# Patient Record
Sex: Male | Born: 1975 | Race: Black or African American | Hispanic: No | Smoking: Never smoker
Health system: Southern US, Community
[De-identification: ages and names within clinical notes are randomized; demographics above are authoritative.]

---

## 1997-08-24 ENCOUNTER — Other Ambulatory Visit: Admission: RE | Admit: 1997-08-24 | Discharge: 1997-08-24 | Payer: Self-pay | Admitting: Family Medicine

## 2002-10-07 ENCOUNTER — Emergency Department (HOSPITAL_COMMUNITY): Admission: EM | Admit: 2002-10-07 | Discharge: 2002-10-08 | Payer: Self-pay | Admitting: Emergency Medicine

## 2004-08-09 ENCOUNTER — Emergency Department (HOSPITAL_COMMUNITY): Admission: EM | Admit: 2004-08-09 | Discharge: 2004-08-09 | Payer: Self-pay | Admitting: Emergency Medicine

## 2005-05-16 ENCOUNTER — Emergency Department (HOSPITAL_COMMUNITY): Admission: EM | Admit: 2005-05-16 | Discharge: 2005-05-16 | Payer: Self-pay | Admitting: Family Medicine

## 2005-09-02 ENCOUNTER — Emergency Department (HOSPITAL_COMMUNITY): Admission: EM | Admit: 2005-09-02 | Discharge: 2005-09-02 | Payer: Self-pay | Admitting: Family Medicine

## 2013-12-25 ENCOUNTER — Emergency Department (HOSPITAL_COMMUNITY)
Admission: EM | Admit: 2013-12-25 | Discharge: 2013-12-25 | Disposition: A | Discharge status: Home / Self Care | Payer: Self-pay | Attending: Emergency Medicine | Admitting: Emergency Medicine

## 2013-12-25 ENCOUNTER — Emergency Department (HOSPITAL_COMMUNITY): Payer: Self-pay

## 2013-12-25 ENCOUNTER — Encounter (HOSPITAL_COMMUNITY): Payer: Self-pay | Admitting: *Deleted

## 2013-12-25 DIAGNOSIS — H9203 Otalgia, bilateral: Secondary | ICD-10-CM | POA: Insufficient documentation

## 2013-12-25 DIAGNOSIS — J069 Acute upper respiratory infection, unspecified: Secondary | ICD-10-CM | POA: Insufficient documentation

## 2013-12-25 LAB — RAPID STREP SCREEN (MED CTR MEBANE ONLY): Streptococcus, Group A Screen (Direct): NEGATIVE

## 2013-12-25 LAB — TROPONIN I: Troponin I: <0.3 ng/mL (ref <0.30)

## 2013-12-25 LAB — MONONUCLEOSIS SCREEN: Mono Screen: NEGATIVE

## 2013-12-25 NOTE — ED Provider Notes (Signed)
CSN: [id number]     Arrival date & time 12/25/13  1359 History   First MD Initiated Contact with Patient 12/25/13 1508     Chief Complaint  Patient presents with  . Otalgia  . Sore Throat     (Consider location/radiation/quality/duration/timing/severity/associated sxs/prior Treatment) Patient is a 38 y.o. male presenting with ear pain and pharyngitis. The history is provided by the patient. No language interpreter was used.  Otalgia Associated symptoms: congestion, cough and sore throat   Associated symptoms: no abdominal pain, no fever, no headaches, no neck pain and no vomiting   Sore Throat Associated symptoms include chills, congestion, coughing and a sore throat. Pertinent negatives include no abdominal pain, chest pain, fever, headaches, nausea, neck pain, numbness, vomiting or weakness.  Brett Mattesimothy S Hearty is a 38 y/o M with no known significant PMHx presenting to the ED with cough, nasal congestion, sore throat, bilateral ear pain, and chest tightness that has been ongoing for the past 2 days. Patient reported that when he coughs he has production of sputum - more so in the morning. Stated that he has been experiencing sinus pressure and mild frontal headache that has progressively increased. Stated that his ears have been bothering him, left followed by the right. Patient stated that he has been placing peroxide in the ears. Reported that his nephews were diagnosed with pneumonia - reported that his children were sick with cold-like symptoms. Stated that he was incarcerated and recently released from jail. 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 Denied worst headache of life, sudden onset, fever, neck pain, neck stiffness, difficulty swallowing, throat closing  sensation.  PCP none   History reviewed. No pertinent past medical history. History reviewed. No pertinent past surgical history. History reviewed. No pertinent family history. History  Substance Use Topics  . Smoking status: Not on file  . Smokeless tobacco: Not on file  . Alcohol Use: No    Review of Systems  Constitutional: Positive for chills. Negative for fever.  HENT: Positive for congestion, ear pain and sore throat. Negative for trouble swallowing.   Eyes: Negative for visual disturbance.  Respiratory: Positive for cough and chest tightness. Negative for shortness of breath.   Cardiovascular: Negative for chest pain.  Gastrointestinal: Negative for nausea, vomiting and abdominal pain.  Musculoskeletal: Negative for back pain and neck pain.  Neurological: Negative for dizziness, weakness, numbness and headaches.      Allergies  Shellfish allergy  Home Medications   Prior to Admission medications   Not on File   BP 108/69 mmHg  Pulse 67  Temp(Src) 98 F (36.7 C) (Oral)  Resp 20  SpO2 100% Physical Exam  Constitutional: He is oriented to person, place, and time. He appears well-developed and well-nourished. No distress.  HENT:  Head: Normocephalic and atraumatic.  Right Ear: Hearing and external ear normal. Tympanic membrane is erythematous (mild) and retracted. Tympanic membrane is not bulging.  Left Ear: Hearing and external ear normal. Tympanic membrane is erythematous (mild) and retracted.  Mouth/Throat: Oropharynx is clear and moist. No oropharyngeal exudate.  Ears: Erythema and scaling noted to the skin lining the canal. Negative bulging of the TM.   Mouth: Negative swelling, erythema, inflammation, lesions, sores, deformities noted to the posterior  oropharynx and tonsils. Negative tonsillar adenopathy. Negative uvula swelling, uvula midline with symmetrical elevation. Negative trismus. Negative sublingual lesions.   Eyes: Conjunctivae and EOM are normal.  Pupils are equal, round, and reactive to light. Right eye exhibits no discharge. Left eye exhibits no discharge.  Neck: Normal range of motion. Neck supple. No tracheal deviation present.  Negative neck stiffness Negative nuchal rigidity  Negative cervical lymphadenopathy  Negative meningeal signs   Cardiovascular: Normal rate, regular rhythm and normal heart sounds.  Exam reveals no friction rub.   No murmur heard. Cap refill < 3 seconds  Pulmonary/Chest: Effort normal and breath sounds normal. No respiratory distress. He has no wheezes. He has no rales. He exhibits no tenderness.  Patient is able to speak in full sentences  Negative use of accessory muscles Negative stridor Negative pain upon palpation to the chest wall  Musculoskeletal: Normal range of motion.  Full ROM to upper and lower extremities without difficulty noted, negative ataxia noted.  Lymphadenopathy:    He has no cervical adenopathy.  Neurological: He is alert and oriented to person, place, and time. No cranial nerve deficit. He exhibits normal muscle tone. Coordination normal.  Skin: Skin is warm and dry. No rash noted. He is not diaphoretic. No erythema.  Psychiatric: He has a normal mood and affect. His behavior is normal. Thought content normal.  Nursing note and vitals reviewed.   ED Course  Procedures (including critical care time)  Labs Review Labs Reviewed  RAPID STREP SCREEN  CULTURE, GROUP A STREP  MONONUCLEOSIS SCREEN  TROPONIN I    Imaging Review    EKG Interpretation None      MDM   Final diagnoses:  None   Medications - No data to display  Filed Vitals:   12/25/13 1419 12/25/13 1749  BP: 121/67 108/69  Pulse: 75 67  Temp: 98.5 F (36.9 C) 98 F (36.7 C)  TempSrc: Oral Oral  Resp: 18 20  SpO2: 96% 100%   Doubt meningitis. Patient presenting to the ED with multiple complaints - suspicion high for URI. Rapid strep test negative. Mono test negative. Ears noted to be  erythematous and scaly - suspicion to be due to peroxide use. Discussed with patient that he is to discontinuous this immediately for this is similar to a chemical burn and is not helping his ear pain. EKG, chest xray, and troponin ordered secondary to chest discomfort - doubt ACS or PE. Imaging and further work-up pending. Discussed case in great detail with Ladona MowJoe Mintz, PA-C. Transfer of care to Ladona MowJoe Mintz, PA-C at change in shift.   Raymon MuttonMarissa Ilham Roughton, PA-C 12/25/13 1923  Raymon MuttonMarissa Latayvia Mandujano, PA-C 12/25/13 1925  Gilda Creasehristopher J. Pollina, MD 12/29/13 [phone]

## 2013-12-25 NOTE — Discharge Instructions (Signed)
You may use over-the-counter cold and flu medicines for your symptoms. Follow-up with otolaryngology. Refer to resource guide below to help find a primary care physician to follow up with. Return to the ER with any worsening of symptoms, hearing loss, dizziness, severe headache, high fever above 100.9F, nausea, vomiting, chest pain, shortness of breath.   Upper Respiratory Infection, Adult An upper respiratory infection (URI) is also sometimes known as the common cold. The upper respiratory tract includes the nose, sinuses, throat, trachea, and bronchi. Bronchi are the airways leading to the lungs. Most people improve within 1 week, but symptoms can last up to 2 weeks. A residual cough may last even longer.  CAUSES Many different viruses can infect the tissues lining the upper respiratory tract. The tissues become irritated and inflamed and often become very moist. Mucus production is also common. A cold is contagious. You can easily spread the virus to others by oral contact. This includes kissing, sharing a glass, coughing, or sneezing. Touching your mouth or nose and then touching a surface, which is then touched by another person, can also spread the virus. SYMPTOMS  Symptoms typically develop 1 to 3 days after you come in contact with a cold virus. Symptoms vary from person to person. They may include:  Runny nose.  Sneezing.  Nasal congestion.  Sinus irritation.  Sore throat.  Loss of voice (laryngitis).  Cough.  Fatigue.  Muscle aches.  Loss of appetite.  Headache.  Low-grade fever. DIAGNOSIS  You might diagnose your own cold based on familiar symptoms, since most people get a cold 2 to 3 times a year. Your caregiver can confirm this based on your exam. Most importantly, your caregiver can check that your symptoms are not due to another disease such as strep throat, sinusitis, pneumonia, asthma, or epiglottitis. Blood tests, throat tests, and X-rays are not necessary to  diagnose a common cold, but they may sometimes be helpful in excluding other more serious diseases. Your caregiver will decide if any further tests are required. RISKS AND COMPLICATIONS  You may be at risk for a more severe case of the common cold if you smoke cigarettes, have chronic heart disease (such as heart failure) or lung disease (such as asthma), or if you have a weakened immune system. The very young and very old are also at risk for more serious infections. Bacterial sinusitis, middle ear infections, and bacterial pneumonia can complicate the common cold. The common cold can worsen asthma and chronic obstructive pulmonary disease (COPD). Sometimes, these complications can require emergency medical care and may be life-threatening. PREVENTION  The best way to protect against getting a cold is to practice good hygiene. Avoid oral or hand contact with people with cold symptoms. Wash your hands often if contact occurs. There is no clear evidence that vitamin C, vitamin E, echinacea, or exercise reduces the chance of developing a cold. However, it is always recommended to get plenty of rest and practice good nutrition. TREATMENT  Treatment is directed at relieving symptoms. There is no cure. Antibiotics are not effective, because the infection is caused by a virus, not by bacteria. Treatment may include:  Increased fluid intake. Sports drinks offer valuable electrolytes, sugars, and fluids.  Breathing heated mist or steam (vaporizer or shower).  Eating chicken soup or other clear broths, and maintaining good nutrition.  Getting plenty of rest.  Using gargles or lozenges for comfort.  Controlling fevers with ibuprofen or acetaminophen as directed by your caregiver.  Increasing usage of  your inhaler if you have asthma. Zinc gel and zinc lozenges, taken in the first 24 hours of the common cold, can shorten the duration and lessen the severity of symptoms. Pain medicines may help with fever,  muscle aches, and throat pain. A variety of non-prescription medicines are available to treat congestion and runny nose. Your caregiver can make recommendations and may suggest nasal or lung inhalers for other symptoms.  HOME CARE INSTRUCTIONS   Only take over-the-counter or prescription medicines for pain, discomfort, or fever as directed by your caregiver.  Use a warm mist humidifier or inhale steam from a shower to increase air moisture. This may keep secretions moist and make it easier to breathe.  Drink enough water and fluids to keep your urine clear or pale yellow.  Rest as needed.  Return to work when your temperature has returned to normal or as your caregiver advises. You may need to stay home longer to avoid infecting others. You can also use a face mask and careful hand washing to prevent spread of the virus. SEEK MEDICAL CARE IF:   After the first few days, you feel you are getting worse rather than better.  You need your caregiver's advice about medicines to control symptoms.  You develop chills, worsening shortness of breath, or brown or red sputum. These may be signs of pneumonia.  You develop yellow or brown nasal discharge or pain in the face, especially when you bend forward. These may be signs of sinusitis.  You develop a fever, swollen neck glands, pain with swallowing, or white areas in the back of your throat. These may be signs of strep throat. SEEK IMMEDIATE MEDICAL CARE IF:   You have a fever.  You develop severe or persistent headache, ear pain, sinus pain, or chest pain.  You develop wheezing, a prolonged cough, cough up blood, or have a change in your usual mucus (if you have chronic lung disease).  You develop sore muscles or a stiff neck. Document Released: 07/09/2000 Document Revised: 04/07/2011 Document Reviewed: 04/20/2013 [facility] Patient Information 2015 [place], [place]. This information is not intended to replace advice given to you by your health  care provider. Make sure you discuss any questions you have with your health care provider.   Emergency Department Resource Guide 1) Find a Doctor and Pay Out of Pocket Although you won't have to find out who is covered by your insurance plan, it is a good idea to ask around and get recommendations. You will then need to call the office and see if the doctor you have chosen will accept you as a new patient and what types of options they offer for patients who are self-pay. Some doctors offer discounts or will set up payment plans for their patients who do not have insurance, but you will need to ask so you aren't surprised when you get to your appointment.  2) Contact Your Local Health Department Not all health departments have doctors that can see patients for sick visits, but many do, so it is worth a call to see if yours does. If you don't know where your local health department is, you can check in your phone book. The CDC also has a tool to help you locate your state's health department, and many state websites also have listings of all of their local health departments.  3) Find a Walk-in Clinic If your illness is not likely to be very severe or complicated, you may want to try a walk in  clinic. These are popping up all over the country in pharmacies, drugstores, and shopping centers. They're usually staffed by nurse practitioners or physician assistants that have been trained to treat common illnesses and complaints. They're usually fairly quick and inexpensive. However, if you have serious medical issues or chronic medical problems, these are probably not your best option.  No Primary Care Doctor: - Call Health Connect at  [phone] - they can help you locate a primary care doctor that  accepts your insurance, provides certain services, etc. - Physician Referral Service- [phone]  Chronic Pain Problems: Organization         Address  Phone   Notes  [facility]   [phone] Patients need to be referred by their primary care doctor.   Medication Assistance: Organization         Address  Phone   Notes  [facility] Medication [facility] [address] [place], [place] [address] [phone]) [phone] --Must be a resident of [place] [facility] [facility] -- Must have NO insurance coverage whatsoever (no Medicaid/ Medicare, etc.) -- The pt. MUST have a primary care doctor that directs their care regularly and follows them in the community   MedAssist  [phone]) [phone]   [employer]  [phone]    Agencies that provide inexpensive medical care: Organization         Address  Phone   Notes  [facility]  [phone]   [facility]    [phone]   [facility] [address] [place], [place] [address] [phone]) [phone]   [facility] [address], [place] [phone]   [facility]anned Parenthood    [phone]) [phone]   [facility]    [phone]   [facility]  [address], [place] Phone:  [phone](336) [phone], Fax:  [phone] Hours of Operation:  9 am - 6 pm, M-F.  Also accepts Medicaid/Medicare and self-pay.  [facility]  [address], [place] Phone: [phone], Fax: [phone]) [phone]. Hours of Operation:  8:30 am - 5:30 pm, M-F.  Also accepts Medicaid and self-pay.  [facility] [place] [address], [place] Phone: [phone]) [phone]   [facility] [address], [place], [place] [phone], Ext. [phone] Mondays & Thursdays: 7-9 AM.  First 15 patients are seen on a first come, first serve basis.    Medicaid-accepting [facility] Providers:  Organization         Address  Phone   Notes  [facility] [address], [place] [phone] Also accepts self-pay patients.  [facility] [address],  [place]  [phone]) [phone]   [facility] [address], [place] [phone]) [phone]   [facility] [address] [place] [address], [place] [phone]) [phone]   Renaye RakersVeita Bland [address], [place]   [phone] Only accepts [place] Access [place] patients after they have their name applied to their card.   Self-Pay (no insurance) in [place]:  Organization         Address  Phone   Notes  Sickle Cell Patients, [facility] [address], [place] [phone]) [phone]   [facility] [address], [place] [phone]   [facility] [place]  [address], [place] [phone]   [facility]/Dr. Osei-Bonsu  [address] [place] or [address], [place] [phone] Phone number for both [place] and [place] locations is the same.  [facility] [address] [place] [phone]   [facility] [address], [place] or [address] [phone]   [facility] [address] [place] [phone], phone; [phone], fax Sees patients 1st and 3rd Saturday of every month.  Must not qualify for public or private insurance (i.e. Medicaid, Medicare, [place] Health Choice, Veterans' Benefits)  Household income should be no more than 200% of the poverty level The clinic cannot treat you if you are pregnant or think you are pregnant  Sexually transmitted diseases are not treated at the clinic.    Dental Care: Organization         Address  Phone  Notes  [facility] Department of [facility] [address] [place], [place] [phone] Accepts children up to age 12 who are enrolled in [place] or [place] Health Choice; pregnant women with a Medicaid card; and children who have applied for Medicaid or [place] Health  Choice, but were declined, whose parents can pay a reduced fee at time of service.  [facility]  [address], [place] [phone] Accepts children up to age 28 who are enrolled in [place] or [place] Health Choice; pregnant women with a Medicaid card; and children who have applied for Medicaid or [place] Health Choice, but were declined, whose parents can pay a reduced fee at time of service.  [facility]  [address] [place], [place] [phone] Patients are seen by appointment only. Walk-ins are not accepted. [facility] will see patients 83 years of age and older. Monday - Tuesday (8am-5pm) Most Wednesdays (8:30-5pm) $30 per visit, cash only  [facility] Adult [facility] Access PRO[facility]  [address], [facility] [phone] Patients are seen by appointment only. Walk-ins are not accepted. [facility] will see patients 65 years of age and older. One Wednesday Evening (Monthly: Volunteer Based).  $30 per visit, cash only  [employer]  [phone] for adults; Children under age 15, call Graduate Pediatric Dentistry at [phone]. Children aged 59-14, please call [phone] to request a pediatric application.  Dental services are provided in all areas of dental care including fillings, crowns and bridges, complete and partial dentures, implants, gum treatment, root canals, and extractions. Preventive care is also provided. Treatment is provided to both adults and children. Patients are selected via a lottery and there is often a waiting list.   [facility] [address] [place]  [phone] [email or web address]   [facility] [address] [place], [place] [phone], Ext. [phone] Second and Fourth Thursday of each month, opens at 6:30 AM; Clinic ends at 9 AM.  Patients are seen on a first-come first-served basis, and a limited number are seen during each  clinic.   [facility]  [address] [place] [place], [place] [phone]   Eligibility Requirements You must have lived in [place], [place], or [place] counties for at least the last three months.   You cannot be eligible for state or federal sponsored [place], including  [employer], [place], or [employer].   You generally cannot be eligible for healthcare insurance through your employer.    How to apply: Eligibility screenings are held every Tuesday and Wednesday afternoon from 1:00 pm until 4:00 pm. You do not need an appointment for the interview!  [facility] [address] [place], [place] [phone]   [facility] Health Department  [phone]   [facility] Health Department  [phone]   [facility] Health Department  [phone]    Behavioral Health Resources in the Community: Intensive Outpatient Programs Organization         Address  Phone  Notes  [facility] Services [address] [place], [place] [phone]   [facility] [address] [place], [place] [phone]   [facility] [address], [place], [place]  [phone]   [facility] [address]  [place], [place] [phone] or [phone]   Substance Abuse Resources Organization         Address  Phone  Notes  [facility] Drug Services  [phone]   [facility]  [phone]   [facility]  [phone]   Floydene Flock  [phone]   Residential & Outpatient Substance Abuse Program  [phone]   Psychological Services Organization         Address  Phone  Notes  [facility] Behavioral Health  [phone]   [facility] Services  [phone]   [facility] Mental Health [address] [place] [phone] or [phone]    Mobile Crisis Teams Organization         Address  Phone  Notes  [facility], Mobile  Crisis Care Unit  [phone]   [facility]  [address] [place], [place] [phone]   Doristine Locks [address] [place] [phone]    Self-Help/Support Groups Organization         Address  Phone             Notes  [facility]ental [facility]. of [address] - variety of support groups  [phone]- I7437963 Call for more information  Narcotics Anonymous (NA), [facility]aring [facility] [address], [employer]   2 meetings at this location   Statistician         Address  Phone  Notes  [facility] [address],    [place]  [phone]   [facility]  [address], [place] [address], [place], [place] [phone]   [facility] [address] [place], [place] [phone] Admissions: 8am-3pm M-F  [facility] [address],    [place], [place] [phone]   [facility] [address] [place], [place], [place] [phone]   [facility] [address],  [place], [place] [phone]   [facility] - Intensive Outpatient [address], [place], [place] [phone]   [facility] (Addiction Recovery [facility] [address] [place]  [place], [place] [phone] or [phone]   [facility] (RTS) [address], [place], [place] [phone] Accepts Medicaid  [facility] [place] [address]  [place] [phone] Substance Abuse/Addiction Treatment   [facility] Organization         Address  Phone  Notes  [facility]  [phone]   Angie Fava, PhD [address] [place], [place]   [phone] or [phone]  [facility]   [address] [place], [place] [phone]   [facility] [address] [place], [place] [phone] Insurance/Medicaid/sponsorship through [facility] and Families [address]                                    [place], [place] [phone] Therapy/tele-psych/case  [facility] [address]   [place], [place] [phone]    Dr. Lolly Mustache  [phone]   [facility] [place]  [employer] [facility] Dept. 1) [address], [place] 2) [address] [place] 3)  [address], [place] [phone]  [phone]   [facility] Child Abuse Hotline 364-658-7725 or [phone] (After Hours)

## 2013-12-25 NOTE — ED Notes (Signed)
Pt reports having fever/chills, sore throat and bilateral ear pain for several days. Airway intact and no acute distress noted.

## 2013-12-25 NOTE — ED Provider Notes (Signed)
Patient signed out to me by Fonnie BirkenheadMarisa Sciacca, PA-C with plan to follow-up on patient's results of chest x-ray, troponin, and discharge if there are no acute abnormalities patient's lab work.  Troponin negative, mononucleosis screen negative, rapid strep negative. Group A strep culture in process. Chest x-ray unremarkable for any acute cardiopulmonary abnormalities.  Patient afebrile, non-tachycardic, nontachypneic, non-hypoxic, in no acute distress. TMs appear mildly sunken, with scaly appearance. External auditory canals erythematous. Patient instructed to stop using hydrogen peroxide in his ears, and given follow-up to otolaryngology for his ear pain and irritation. I discussed return precautions with patient, and discussed likely viral etiology of his pharyngitis. I encouraged patient to call or return to the ER should he have any questions or concerns.  BP 110/66 mmHg  Pulse 66  Temp(Src) 98.4 F (36.9 C) (Oral)  Resp 24  SpO2 100%  Signed,  Ladona MowJoe Journii Nierman, PA-C 2:53 AM      Monte FantasiaJoseph W Leighann Amadon, PA-C 12/26/13 [phone]

## 2013-12-27 LAB — CULTURE, GROUP A STREP

## 2020-11-26 ENCOUNTER — Ambulatory Visit (INDEPENDENT_AMBULATORY_CARE_PROVIDER_SITE_OTHER): Payer: Self-pay

## 2020-11-26 ENCOUNTER — Other Ambulatory Visit: Payer: Self-pay

## 2020-11-26 ENCOUNTER — Ambulatory Visit (HOSPITAL_COMMUNITY)
Admission: EM | Admit: 2020-11-26 | Discharge: 2020-11-26 | Disposition: A | Discharge status: Home / Self Care | Payer: Self-pay | Attending: Emergency Medicine | Admitting: Emergency Medicine

## 2020-11-26 ENCOUNTER — Encounter (HOSPITAL_COMMUNITY): Payer: Self-pay

## 2020-11-26 DIAGNOSIS — M25521 Pain in right elbow: Secondary | ICD-10-CM

## 2020-11-26 NOTE — Discharge Instructions (Addendum)
Use ibuprofen for pain. Rest but it's also important to move around and not rest too much. Gentle movements can help your muscle injury recover. You may feel worse tomorrow before you start feeling better.
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
## 2023-04-24 ENCOUNTER — Emergency Department (HOSPITAL_BASED_OUTPATIENT_CLINIC_OR_DEPARTMENT_OTHER)
Admission: EM | Admit: 2023-04-24 | Discharge: 2023-04-24 | Disposition: A | Discharge status: Home / Self Care | Payer: Self-pay | Attending: Emergency Medicine | Admitting: Emergency Medicine

## 2023-04-24 ENCOUNTER — Encounter (HOSPITAL_BASED_OUTPATIENT_CLINIC_OR_DEPARTMENT_OTHER): Payer: Self-pay | Admitting: Emergency Medicine

## 2023-04-24 ENCOUNTER — Other Ambulatory Visit: Payer: Self-pay

## 2023-04-24 DIAGNOSIS — M545 Low back pain, unspecified: Secondary | ICD-10-CM | POA: Insufficient documentation

## 2023-04-24 DIAGNOSIS — J111 Influenza due to unidentified influenza virus with other respiratory manifestations: Secondary | ICD-10-CM

## 2023-04-24 DIAGNOSIS — R509 Fever, unspecified: Secondary | ICD-10-CM | POA: Insufficient documentation

## 2023-04-24 DIAGNOSIS — R059 Cough, unspecified: Secondary | ICD-10-CM | POA: Insufficient documentation

## 2023-04-24 DIAGNOSIS — R0981 Nasal congestion: Secondary | ICD-10-CM | POA: Insufficient documentation

## 2023-04-24 LAB — RESP PANEL BY RT-PCR (RSV, FLU A&B, COVID)  RVPGX2
Influenza A by PCR: NEGATIVE
Influenza B by PCR: NEGATIVE
Resp Syncytial Virus by PCR: NEGATIVE
SARS Coronavirus 2 by RT PCR: NEGATIVE

## 2023-04-24 NOTE — Discharge Instructions (Signed)
 You were evaluated in the emergency room for flulike symptoms and back pain.your respiratory panel was negative.  You were provided a referral for orthopedics..You may use Tylenol 1000 mg and/or Motrin 600 mg every 4-6 hours up to 3 times a day for fever or body aches.  Please keep in mind that this dosing is not meant to be continued long-term and that many over-the-counter cough and flu medications contain acetaminophen or ibuprofen. You can expect your current symptoms to linger over the next week or two but please return to the emergency room if you experience any new or worsening symptoms including persistent fevers, worsening productive cough and persistent vomiting. Please follow-up with your primary care provider regarding your ER visit.  It is your responsibility to obtain any results from your visit and review any findings with your primary care doctor.

## 2023-04-24 NOTE — ED Triage Notes (Signed)
 Pt reports cough, congestion, fever, chills, body aches; no ShoB noted in triage

## 2023-04-24 NOTE — ED Provider Notes (Signed)
 [facility] Provider Note   CSN: [id number] Arrival date & time: 04/24/23  1755     History  Chief Complaint  Patient presents with   Flulike Symptoms    Brett Ferguson is a 48 y.o. male otherwise healthy presents with complaints of cough, congestion, subjective fevers and chills that started on Tuesday.  Additionally complains of right lower back pain has been ongoing for the past few weeks.  Denies any recent sick contacts.  No abdominal pain no vomiting.  Regarding back pain he has no radicular symptoms.  There is no injury or trauma.   HPI    History reviewed. No pertinent past medical history.  Home Medications Prior to Admission medications   Not on File      Allergies    Aspirin and Shellfish allergy    Review of Systems   Review of Systems  Respiratory:  Positive for cough.   Musculoskeletal:  Positive for back pain.    Physical Exam Updated Vital Signs BP (!) 131/93 (BP Location: Right Arm)   Pulse 76   Temp 99.2 F (37.3 C) (Oral)   Resp 18   Ht 6' (1.829 m)   Wt 81.6 kg   SpO2 98%   BMI 24.41 kg/m  Physical Exam Vitals and nursing note reviewed.  Constitutional:      General: He is not in acute distress.    Appearance: He is well-developed.  HENT:     Head: Normocephalic and atraumatic.  Eyes:     Conjunctiva/sclera: Conjunctivae normal.  Cardiovascular:     Rate and Rhythm: Normal rate and regular rhythm.     Heart sounds: No murmur heard. Pulmonary:     Effort: Pulmonary effort is normal. No respiratory distress.     Breath sounds: Normal breath sounds. No wheezing or rales.  Abdominal:     Palpations: Abdomen is soft.     Tenderness: There is no abdominal tenderness.  Musculoskeletal:        General: No swelling.     Cervical back: Neck supple.     Comments: Mild tenderness right lower lumbar paraspinal region.  No midline tenderness.  No gross deformities  Skin:    General: Skin is  warm and dry.     Capillary Refill: Capillary refill takes less than 2 seconds.  Neurological:     Mental Status: He is alert.  Psychiatric:        Mood and Affect: Mood normal.     ED Results / Procedures / Treatments   Labs (all labs ordered are listed, but only abnormal results are displayed) Labs Reviewed  RESP PANEL BY RT-PCR (RSV, FLU A&B, COVID)  [id number]    EKG None  Radiology No results found.  Procedures Procedures    Medications Ordered in ED Medications - No data to display  ED Course/ Medical Decision Making/ A&P                                 Medical Decision Making  This patient presents to the ED with chief complaint(s) of flulike symptoms and back pain.  The complaint involves an extensive differential diagnosis and also carries with it a high risk of complications and morbidity.   pertinent past medical history as listed in HPI  The differential diagnosis includes  viral illness, pharyngitis, mono, sinusitis,AOM, pneumonia, musculoskeletal, cauda equina The initial plan is to  Obtain respiratory panel Additional history obtained:  Records reviewed Care Everywhere/External Records  Initial Assessment:   Nontoxic-appearing patient presenting with URI symptoms and atraumatic right lower back pain.  I have a low suspicion for pneumonia as lung sounds are clear and patient is afebrile.  No exudate or significant cervical lymphadenopathy on exam to suggest strep or mono.  She has mild tenderness to right lower lumbar paraspinal region.  No red flag symptoms.  No neurodeficits on exam.  Overall history and exam are most suspicious for viral URI and muscle strain.  Patient is hemodynamically stable and not requiring oxygen.  Anticipate discharge home with supportive care.  Independent ECG interpretation:  None   Independent labs interpretation:  The following labs were independently interpreted:  Respiratory panel negative  Independent visualization and  interpretation of imaging: none  Treatment and Reassessment: No medications administered during visit   Consultations obtained:   None   Disposition:   Patient will be discharged home.  Provided work note and referral for orthopedics.  Educated on supportive care.  Encouraged to follow-up with primary care provider should her symptoms persist. The patient has been appropriately medically screened and/or stabilized in the ED. I have low suspicion for any other emergent medical condition which would require further screening, evaluation or treatment in the ED or require inpatient management. At time of discharge the patient is hemodynamically stable and in no acute distress. I have discussed work-up results and diagnosis with patient and answered all questions. Patient is agreeable with discharge plan. We discussed strict return precautions for returning to the emergency department and they verbalized understanding.     Social Determinants of Health:   none  This note was dictated with voice recognition software.  Despite best efforts at proofreading, errors may have occurred which can change the documentation meaning.          Final Clinical Impression(s) / ED Diagnoses Final diagnoses:  Influenza-like illness  Acute right-sided low back pain without sciatica    Rx / DC Orders ED Discharge Orders     None         Fabienne Bruns 04/24/23 2127    Vanetta Mulders, MD 04/24/23 2312
